# Patient Record
Sex: Male | Born: 2000 | Race: White | Hispanic: No | Marital: Single | Smoking: Never smoker
Health system: Southern US, Community
[De-identification: ages and names within clinical notes are randomized; demographics above are authoritative.]

---
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
## 2023-02-05 IMAGING — US US ABDOMEN LIMITED
1 series · 14 of 25 positions shown · non-contrast
Comparison: None.

CLINICAL DATA: Right upper quadrant pain for 1 week

EXAM:
ULTRASOUND ABDOMEN LIMITED RIGHT UPPER QUADRANT

[Series 1: us abdomen limited ruq (liver/gb) · 14 of 62 slices shown]
[im 1/62]
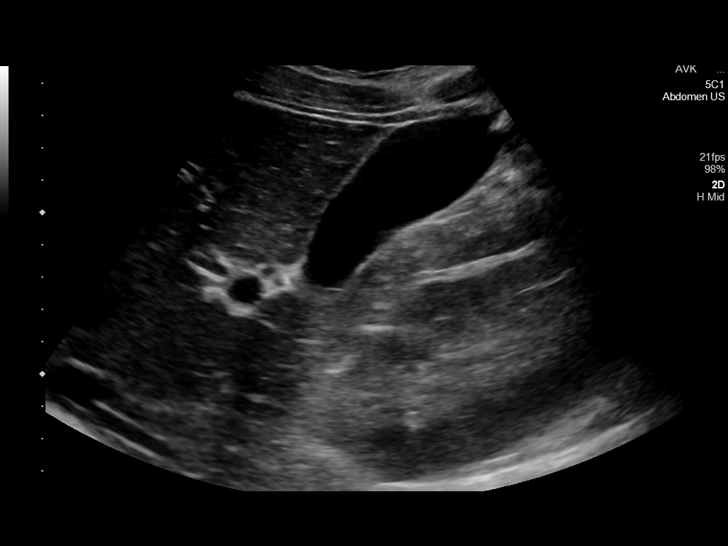
[im 6/62]
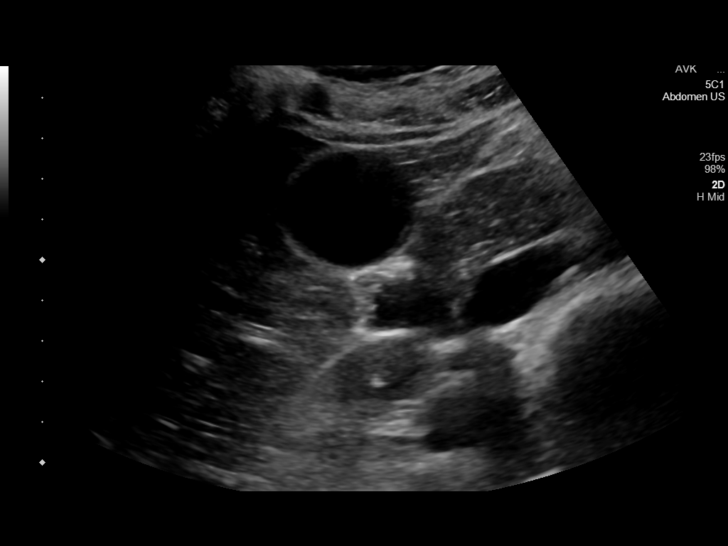
[im 11/62]
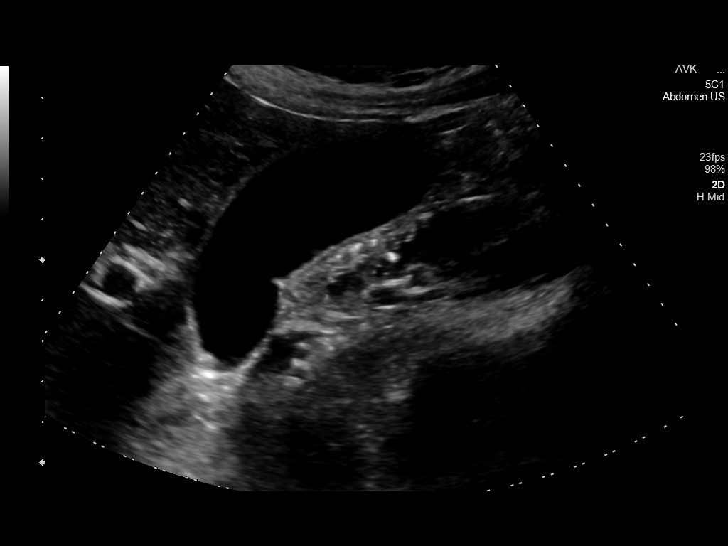
[im 16/62]
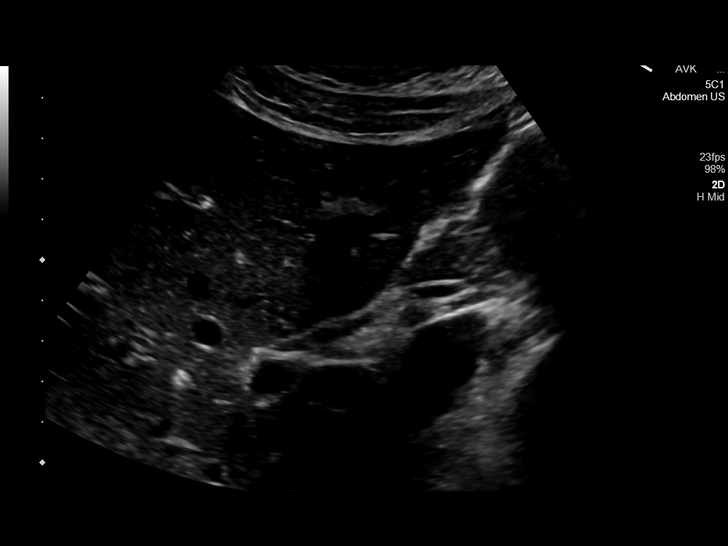
[im 21/62]
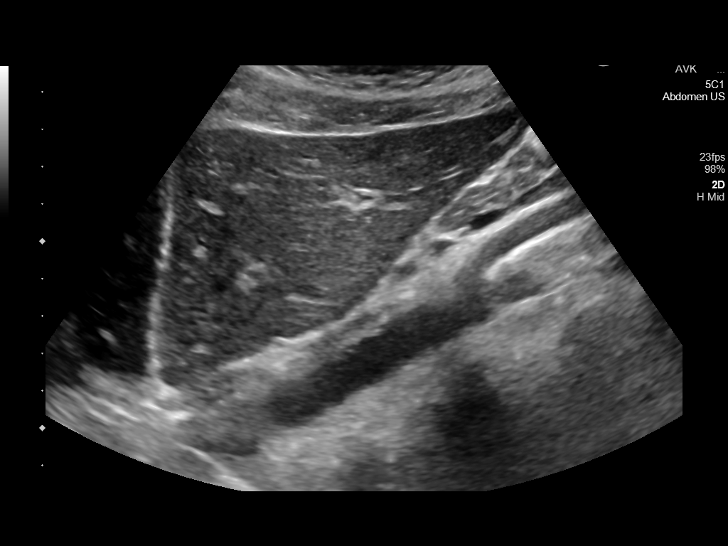
[im 23/62]
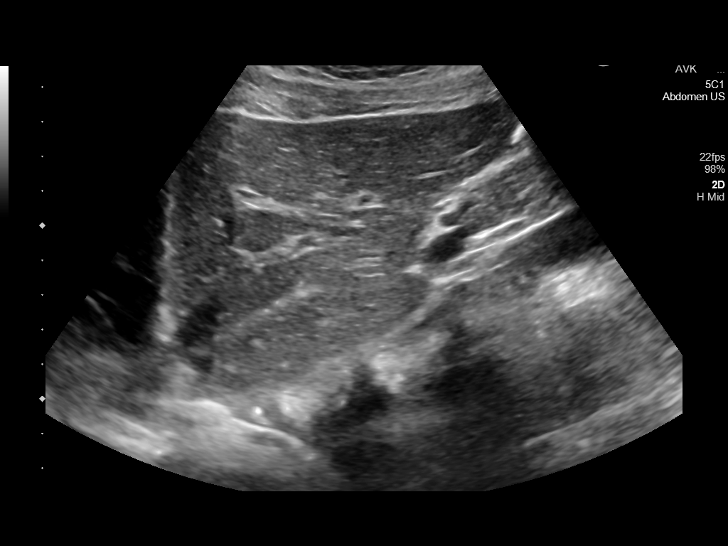
[im 28/62]
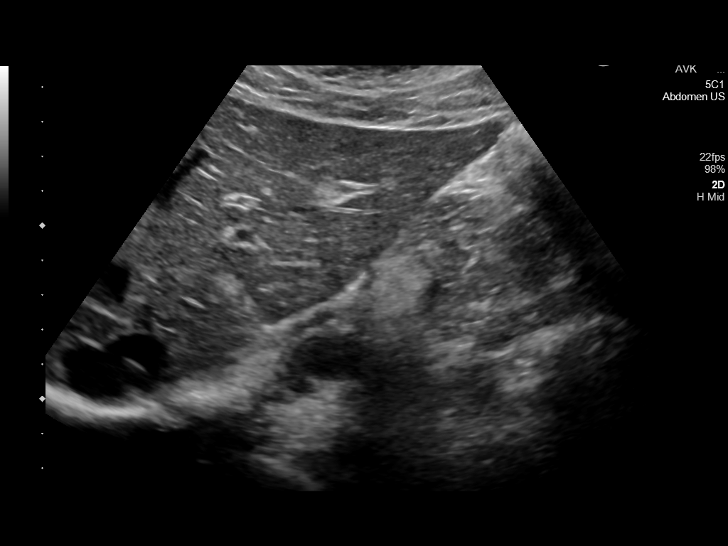
[im 34/62]
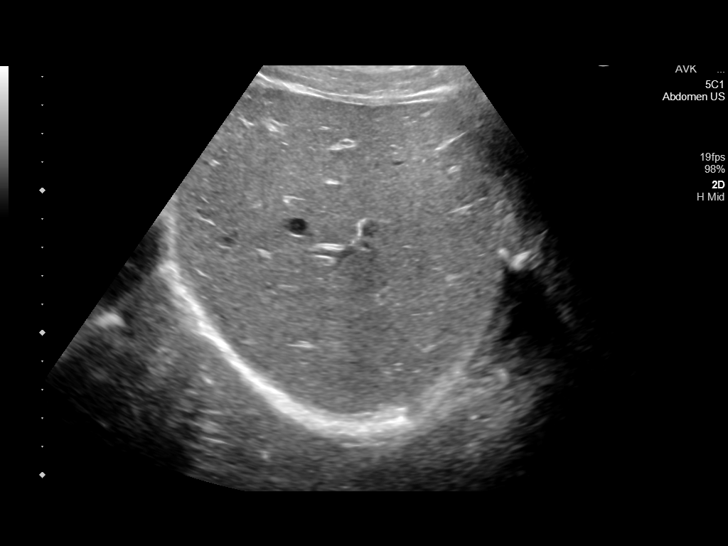
[im 39/62]
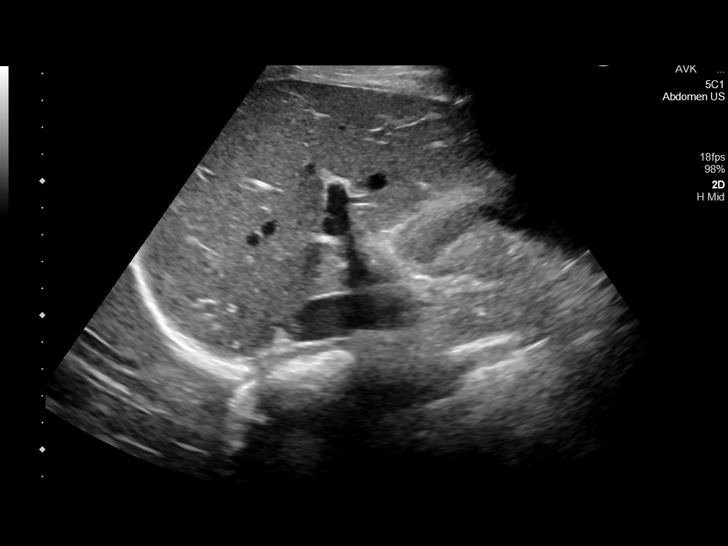
[im 41/62]
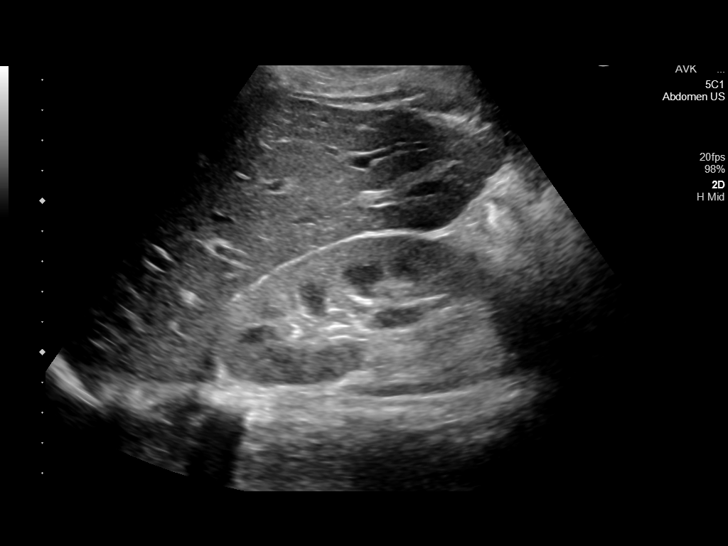
[im 46/62]
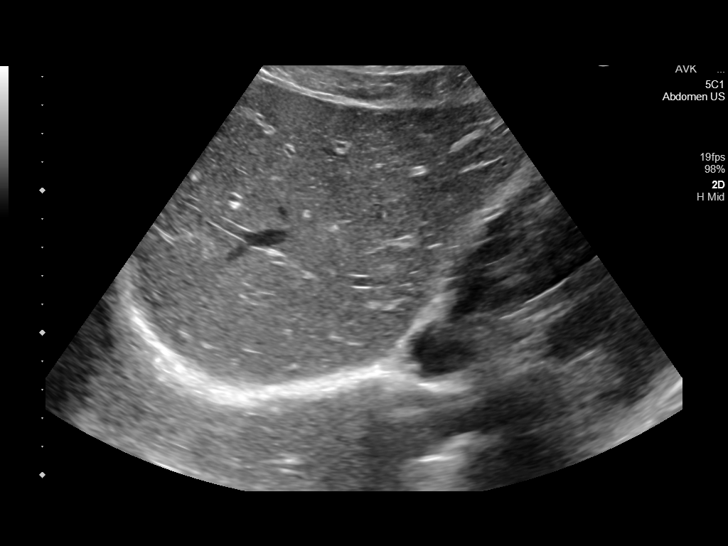
[im 51/62]
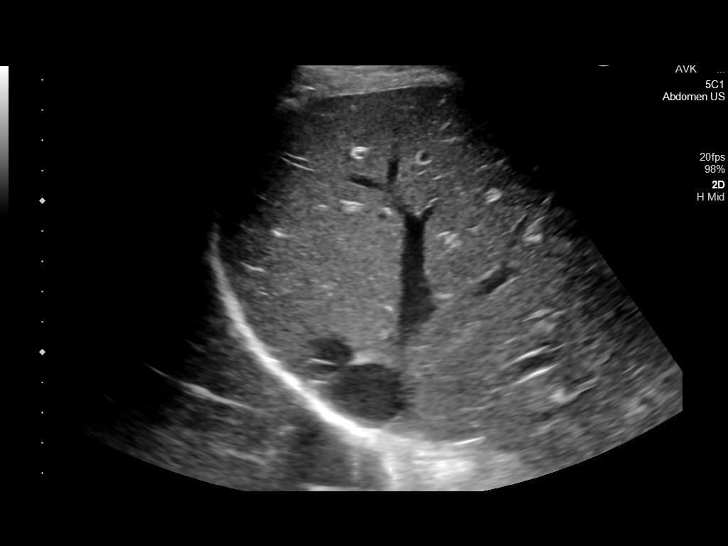
[im 56/62]
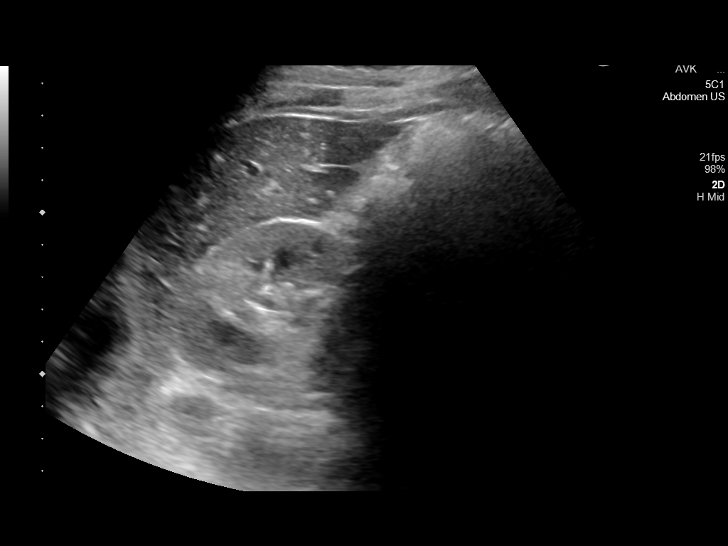
[im 62/62]
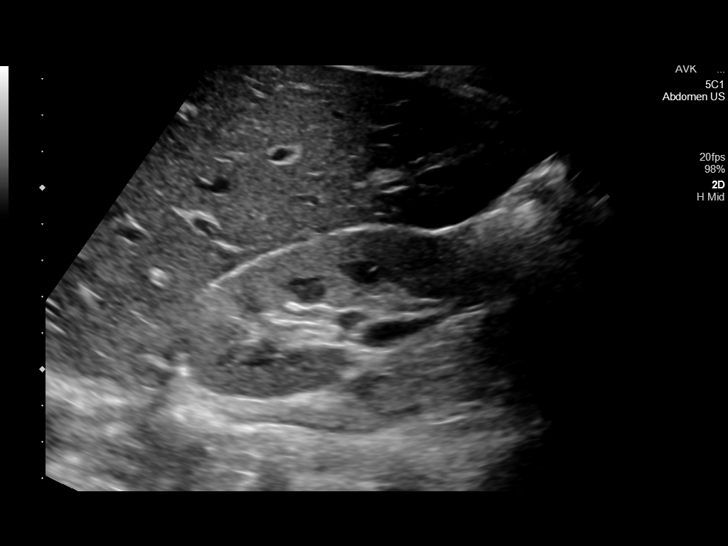

[14 of 25 positions shown; findings below may reference images not displayed]

FINDINGS: Gallbladder:

No gallstones or wall thickening visualized. No sonographic Murphy
sign noted by sonographer.

Common bile duct:

Diameter: 3.5 mm

Liver:

No focal lesion identified. Within normal limits in parenchymal
echogenicity. Portal vein is patent on color Doppler imaging with
normal direction of blood flow towards the liver.

Other: Incidental note made of increased right renal cortical
echogenicity as can be seen with medical renal disease or acute
kidney injury.
IMPRESSION: 1. No cholelithiasis or sonographic evidence of acute cholecystitis.
2. Incidental note made of increased right renal cortical
echogenicity as can be seen with medical renal disease or acute
kidney injury. Correlate with renal function tests.

## 2023-02-05 IMAGING — DX DG CHEST 1V PORT
1 series · 1 of 1 positions shown · non-contrast
Comparison: None.

CLINICAL DATA: Cough, fever

EXAM:
PORTABLE CHEST 1 VIEW

[chest ap]
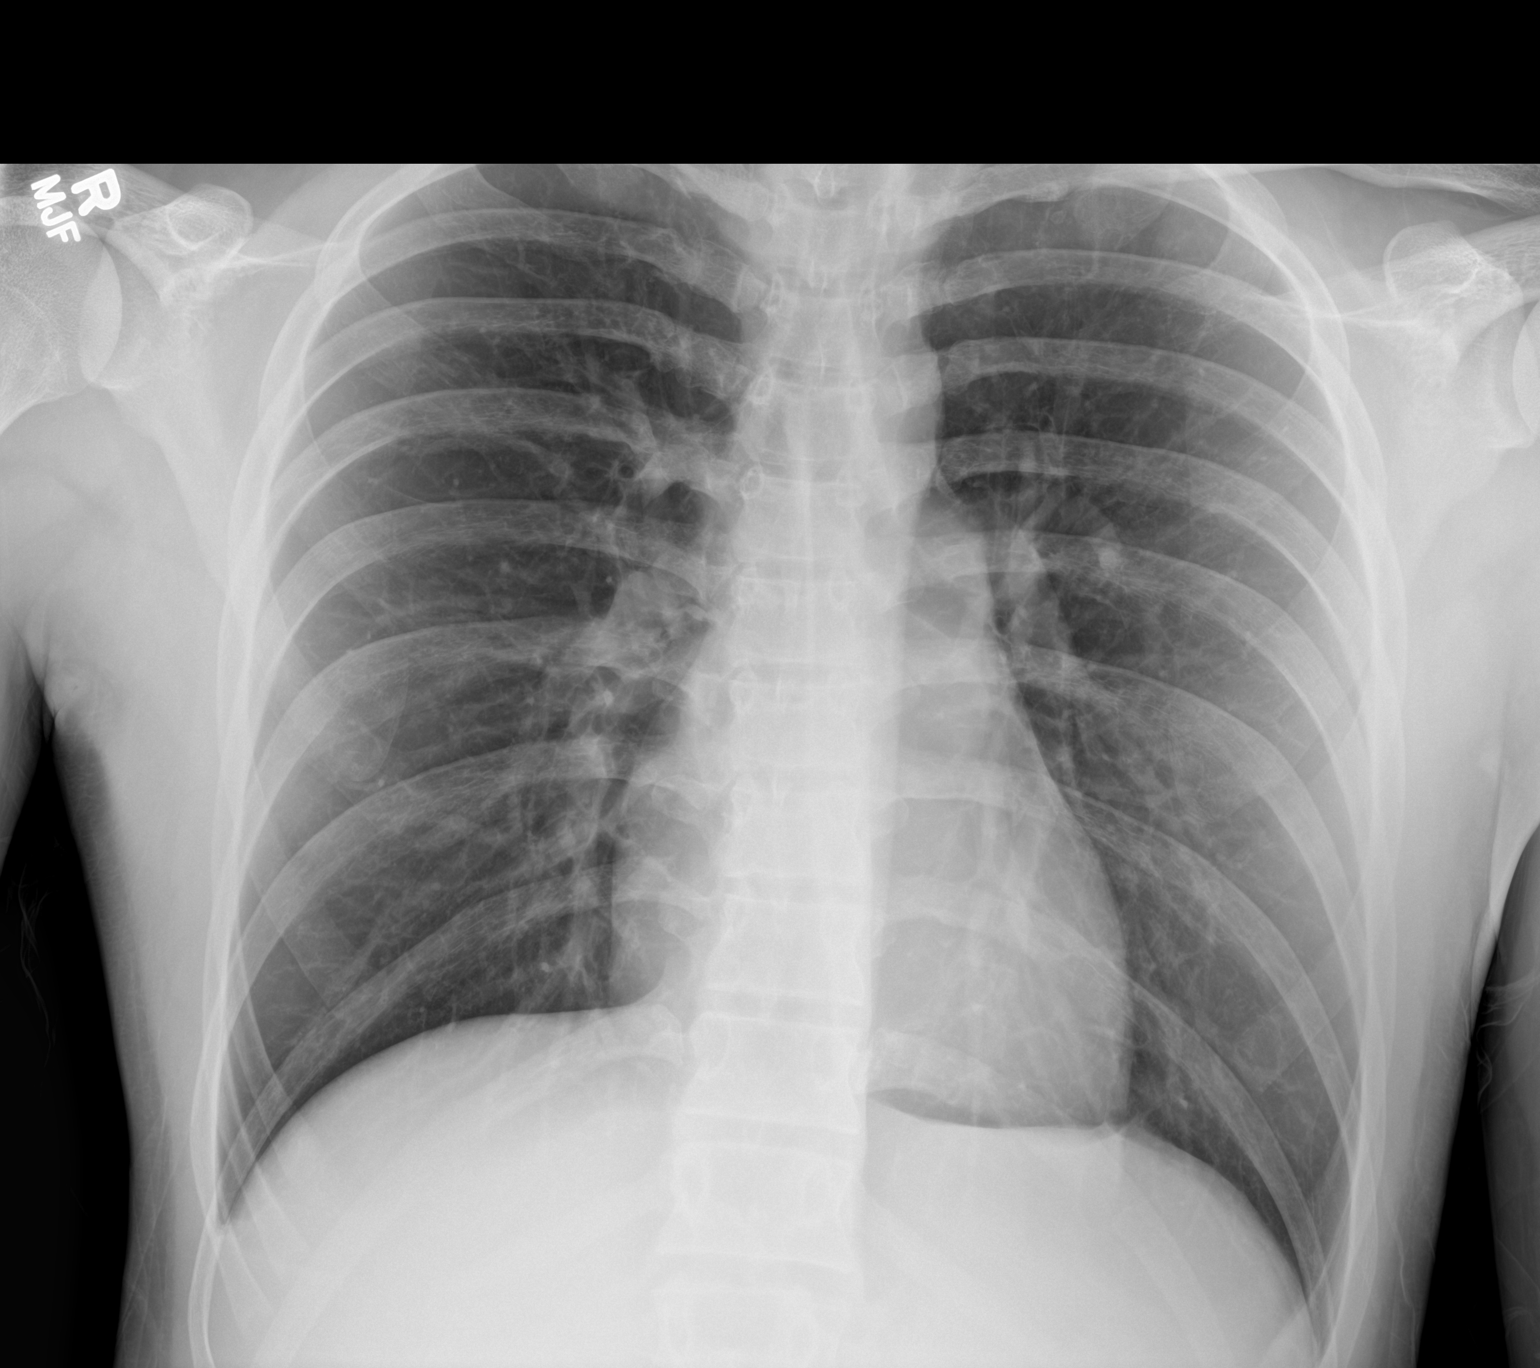

[1 of 1 positions shown; findings below may reference images not displayed]

FINDINGS: The heart and mediastinal contours are within normal limits.

No focal consolidation. No pulmonary edema. No pleural effusion. No
pneumothorax.

No acute osseous abnormality.
IMPRESSION: No active disease.

## 2023-02-09 IMAGING — DX DG CHEST 1V
1 series · 1 of 1 positions shown · non-contrast
Comparison: None.

CLINICAL DATA: Fevers.  Myalgias.

EXAM:
CHEST  1 VIEW

[chest ap]
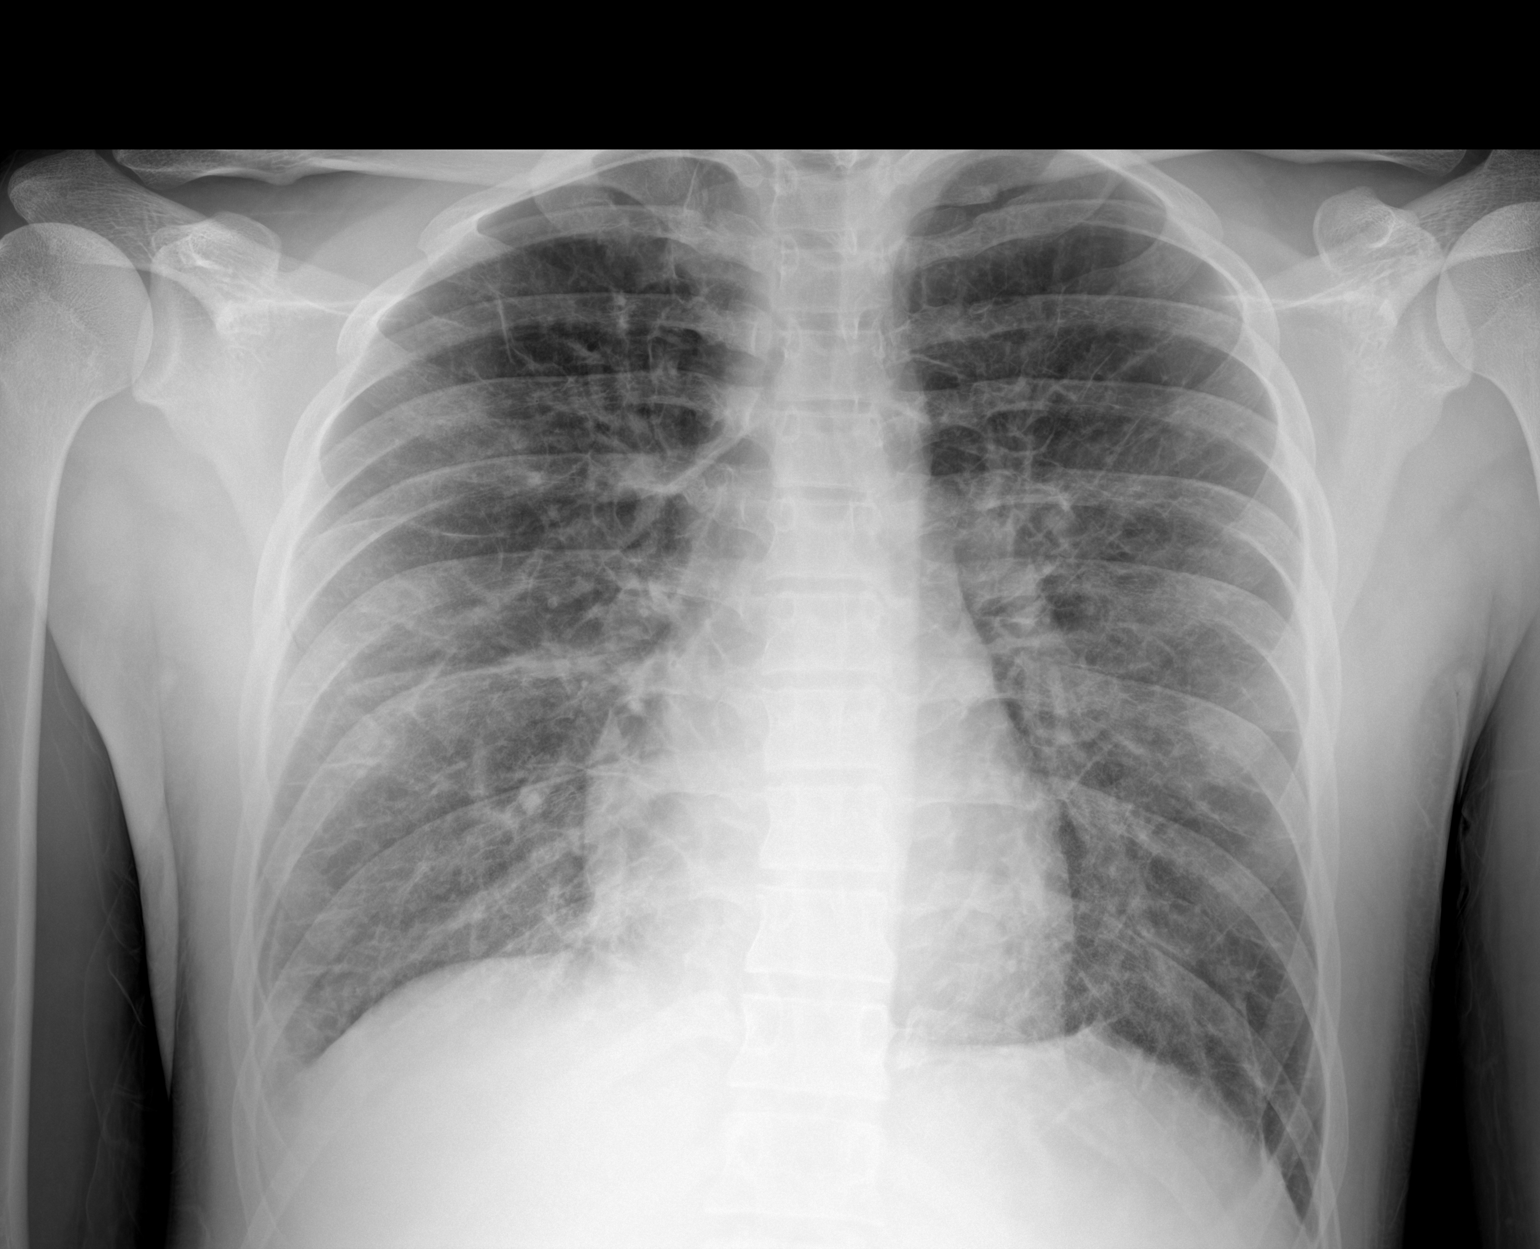

[1 of 1 positions shown; findings below may reference images not displayed]

FINDINGS: Bilateral interstitial opacities are noted. No pneumothorax.
Cardiomediastinal silhouette is normal given portable technique. No
other acute abnormalities are identified.
IMPRESSION: Bilateral interstitial opacities are concerning for atypical
pneumonia given provided history.
